# Patient Record
Sex: Female | Born: 1970 | Hispanic: Yes | Marital: Married | Smoking: Never smoker
Health system: Southern US, Community
[De-identification: ages and names within clinical notes are randomized; demographics above are authoritative.]

## PROBLEM LIST (undated history)

## (undated) DIAGNOSIS — E119 Type 2 diabetes mellitus without complications: Secondary | ICD-10-CM

---

## 2014-04-24 ENCOUNTER — Emergency Department (INDEPENDENT_AMBULATORY_CARE_PROVIDER_SITE_OTHER)
Admission: EM | Admit: 2014-04-24 | Discharge: 2014-04-24 | Disposition: A | Discharge status: Home / Self Care | Payer: Self-pay | Source: Home / Self Care | Attending: Emergency Medicine | Admitting: Emergency Medicine

## 2014-04-24 ENCOUNTER — Encounter (HOSPITAL_COMMUNITY): Payer: Self-pay | Admitting: Emergency Medicine

## 2014-04-24 DIAGNOSIS — B373 Candidiasis of vulva and vagina: Secondary | ICD-10-CM

## 2014-04-24 DIAGNOSIS — B3731 Acute candidiasis of vulva and vagina: Secondary | ICD-10-CM

## 2014-04-24 DIAGNOSIS — E119 Type 2 diabetes mellitus without complications: Secondary | ICD-10-CM

## 2014-04-24 LAB — POCT URINALYSIS DIP (DEVICE)
BILIRUBIN URINE: NEGATIVE
Ketones, ur: 15 mg/dL — AB
Leukocytes, UA: NEGATIVE
NITRITE: NEGATIVE
Protein, ur: 30 mg/dL — AB
Specific Gravity, Urine: >=1.03 (ref 1.005–1.030)
Urobilinogen, UA: 0.2 mg/dL (ref 0.0–1.0)
pH: 6 (ref 5.0–8.0)

## 2014-04-24 LAB — POCT PREGNANCY, URINE: Preg Test, Ur: NEGATIVE

## 2014-04-24 MED ORDER — FLUCONAZOLE 150 MG PO TABS: 150.0000 mg | ORAL_TABLET | Freq: Once | ORAL | Status: AC

## 2014-04-24 NOTE — Discharge Instructions (Signed)
Vulvovaginitis Candidisica (Candidal Vulvovaginitis) La vulvovaginitis candidisica es una infeccin de la vagina y la vulva. La vulva es la piel que rodea la abertura de la vagina. Puede causar picazn y Faroe Islandsmolestias dentro de y alrededor de la vagina.  CUIDADOS EN EL HOGAR  Slo tome medicamentos como lo indique su mdico.  No mantenga relaciones sexuales hasta que la infeccin haya curado o segn le indique el mdico.  Practique sexo seguro.  Informe a su compaero sexual acerca de su infeccin.  No tome duchas vaginales ni use tampones.  Use ropa interior de algodn. No utilice pantalones ni pantimedias ajustados.  Coma yogur. Esto puede ayudar a tratar y prevenir las infecciones por cndida. SOLICITE AYUDA DE INMEDIATO SI:   Tiene fiebre.  Los problemas empeoran durante el tratamiento, o si no mejora luego de [address] das.  Tiene malestar, irritacin, o picazn en la zona de la vagina o la vulva.  Siente dolor en al Gannett Comantener relaciones sexuales.  Comienza a sentir dolor abdominal. ASEGRESE DE QUE:  Comprende estas instrucciones.  Controlar su enfermedad.  Solicitar ayuda de inmediato si no mejora o empeora. Document Released: 06/19/2010 Document Revised: 05/22/2013 [facility] Patient Information 2015 [place], [place]. This information is not intended to replace advice given to you by your health care provider. Make sure you discuss any questions you have with your health care provider.  Diabetes mellitus tipo2 (Type 2 Diabetes Mellitus) La diabetes mellitus tipo2, generalmente denominada diabetes tipo2, es una enfermedad prolongada (crnica). En la diabetes tipo2, el pncreas no produce suficiente insulina (una hormona), las clulas son menos sensibles a la insulina que se produce (resistencia a la insulina), o ambos. Normalmente, la Johnson Controlsinsulina mueve los azcares de los alimentos a las clulas de los tejidos. Las clulas de los tejidos Cendant Corporationutilizan los azcares para Environmental health practitionerobtener  energa. La falta de insulina o la falta de una respuesta normal a la insulina hace que el exceso de azcar se acumule en la sangre en lugar de Customer service managerpenetrar en las clulas de los tejidos. Como resultado, se producen niveles altos de Bankerazcar en la sangre (hiperglucemia). El [address] de los niveles altos de azcar (glucosa) puede causar muchas complicaciones.  La diabetes tipo2 antes tambin se denominaba diabetes del Barreraadulto, pero puede ocurrir a Actuarycualquier edad.  FACTORES DE RIESGO  Una persona tiene mayor predisposicin a desarrollar diabetes tipo 2 si alguien en su familia tiene la enfermedad y tambin tiene uno o ms de los siguientes factores de riesgo principales:  Sobrepeso.  Estilo de vida sedentario.  Una historia de consumo constante de alimentos de altas caloras. Mantener un peso saludable y realizar actividad fsica regular puede reducir la probabilidad de desarrollar diabetes tipo 2. SNTOMAS  Una persona con diabetes tipo 2 no presenta sntomas en un principio. Los sntomas de la diabetes tipo 2 aparecen lentamente. Los sntomas son:  Aumento de la sed (polidipsia).  Aumento de la miccin (poliuria).  Orina con ms frecuencia durante la noche (nocturia).  Prdida de peso. La prdida de peso puede ser muy rpida.  Infecciones frecuentes y recurrentes.  Cansancio (fatiga).  Debilidad.  Cambios en la visin, como visin borrosa.  Olor a Water quality scientistfruta en el aliento.  Dolor abdominal.  Nuseas o vmitos.  Cortes o moretones que tardan en sanarse.  Hormigueo o adormecimiento de las manos y los pies. DIAGNSTICO Con frecuencia la diabetes tipo 2 no se diagnostica hasta que se presentan las complicaciones de la diabetes. La diabetes tipo 2 se diagnostica cuando los sntomas o las complicaciones  se presentan y McKessoncuando aumentan los niveles de glucosa en la Wildomarsangre. El nivel de glucosa en la sangre puede controlarse en uno o ms de los siguientes anlisis de sangre:  Medicin de glucosa en  la sangre en On[place]aayunas. No se le permitir comer durante al menos 8 horas antes de que se tome [place]una muestra de [place]burgsangre.  Pruebas al azar de glucosa en la sangre. El nivel de glucosa en la sangre se controla en cualquier momento del da sin importar el momento en que haya comido.  Prueba de A1c (hemoglobina glucosilada) Una prueba de A1c proporciona informacin sobre el control de la glucosa en la sangre durante los ltimos 3 meses.  Prueba de tolerancia a la glucosa oral (PTGO). La glucosa en la sangre se mide despus de no haber comido (ayunas) durante dos horas y despus de beber una bebida que contenga glucosa. TRATAMIENTO   Usted puede necesitar administrarse insulina o medicamentos para la diabetes todos los das para State Street Corporationmantener los niveles de glucosa en la sangre en el rango deseado.  Si Botswanausa insulina, tal vez necesite ajustar la dosis segn los carbohidratos que haya consumido en cada comida o colacin. El objetivo del tratamiento es mantener el nivel de azcar en la sangre previo a comer (glucosa preprandial) entre 7670 y 130mg /dl. INSTRUCCIONES PARA EL CUIDADO EN EL HOGAR   Controle su nivel de hemoglobina A1c dos veces al ao.  Contrlese a diario Air cabin crewel nivel de glucosa en la sangre segn las indicaciones de su mdico.  Supervise las cetonas en la orina cuando est enferma y segn las indicaciones de su mdico.  Tome el medicamento para la diabetes o adminstrese insulina segn las indicaciones de su mdico para Radio producermantener el nivel de glucosa en la sangre en el rango deseado.  Nunca se quede sin medicamento para la diabetes o sin insulina. Es necesario que la reciba CarMaxtodos los das.  Si [place]ausa insulina, tal vez deba ajustar la cantidad de insulina administrada segn los carbohidratos consumidos. Los hidratos de carbono pueden aumentar los niveles de glucosa en la sangre, pero deben incluirse en su dieta. Los hidratos de carbono aportan vitaminas, minerales y Taylorsvillefibra que son Neomia Dearuna parte esencial de una  dieta saludable. Los hidratos de carbono se encuentran en frutas, verduras, cereales integrales, productos lcteos, legumbres y alimentos que contienen azcares aadidos.  Consuma alimentos saludables. Programe una cita con un nutricionista certificado para que lo ayude a Chief Executive Officerestablecer un plan de alimentacin adecuado para usted.  Baje de peso si es necesario.  Lleve una tarjeta de alerta mdica o use una pulsera o medalla de alerta mdica.  Lleve con usted una colacin de 15gramos de hidratos de carbono en todo momento para controlar los niveles bajos de glucosa en la sangre (hipoglucemia). Algunos ejemplos de colaciones de 15gramos de hidratos de carbono son los siguientes:  Tabletas de glucosa, 3 o 4.  Gel de glucosa, tubo de 15 gramos.  Pasas de uva, 2 cucharadas (24 gramos).  Caramelos de goma, 6.  Galletas de Fern Parkanimales, 8.  Gaseosa comn, 4onzas (120mililitros).  Pastillas de goma, 9.  Reconocer la hipoglucemia. La hipoglucemia se produce cuando los niveles de glucosa en la sangre son de 70 mg/dl o menos. El riesgo de hipoglucemia aumenta durante el ayuno o cuando se saltea las comidas, durante o despus de Education officer, environmentalrealizar ejercicio intenso y Sibleymientras duerme. Los sntomas de hipoglucemia son:  Temblores o sacudidas.  Disminucin de la capacidad de concentracin.  Sudoracin.  Aumento de la frecuencia cardaca.  Dolor de [place].  Sequedad en la boca.  Hambre.  Irritabilidad.  Ansiedad.  Sueo agitado.  Alteracin del habla o de la coordinacin.  Confusin.  Tratar la hipoglucemia rpidamente. Si usted est alerta y puede tragar con seguridad, siga la regla de 15/15 que consiste en:  [employer] 15 y 20gramos de glucosa de accin rpida o carbohidratos. Las opciones de accin rpida son un gel de glucosa, tabletas de glucosa, o 4 onzas (120 ml) de jugo de frutas, gaseosa comn, o leche baja en grasa.  Compruebe su nivel de glucosa en la sangre 15 minutos despus de  tomar la glucosa.  Tome entre 15 y 20 gramos ms de glucosa si el nivel de glucosa en la sangre todava es de 70mg /dl o inferior.  Ingiera una comida o una colacin en el lapso de 1 hora una vez que los niveles de glucosa en la sangre vuelven a la normalidad.  Est atento a si siente mucha sed u orina con mayor frecuencia, porque son signos tempranos de hiperglucemia. El reconocimiento temprano de la hiperglucemia permite un tratamiento oportuno. Trate la hiperglucemia segn le indic su mdico.  Haga, al menos, de actividad fsica moderada a la semana, distribuidos en, por lo menos, 3das a la semana o como lo indique su mdico. Adems, debe realizar ejercicios de resistencia por lo menos 2veces a la semana o como lo indique su mdico. Trate de no permanecer inactivo durante ms de seguidos.  Ajuste su medicamento y la ingesta de alimentos, segn sea necesario, si inicia un nuevo ejercicio o deporte.  Siga su plan para los [address] de enfermedad cuando no puede comer o beber como de [place].  No consuma ningn producto que contenga tabaco, como cigarrillos, tabaco de Theatre manager o Administrator, Civil Service. Si necesita ayuda para dejar de fumar, hable con el mdico.  Limite el consumo de alcohol a no ms de 1 medida por da en las mujeres no embarazadas y 2 medidas en los hombres. Debe beber alcohol solo mientras come. Hable con su mdico acerca de si el alcohol es seguro para usted. Informe a su mdico si bebe alcohol varias veces a la semana.  Concurra a todas las visitas de control como se lo haya indicado el mdico. Esto es importante.  Programe un examen de la vista poco despus del diagnstico de diabetes tipo 2 y luego anualmente.  Realice diariamente el cuidado de la piel y de los pies. Examine su piel y los pies diariamente para ver si tiene cortes, moretones, enrojecimiento, problemas en las uas, sangrado, ampollas o Advertising account planner. Su mdico debe hacerle un examen de los  pies una vez por ao.  Cepllese los dientes y encas por lo menos dos veces al da y use hilo dental al menos una vez por da. Concurra regularmente a las visitas de control con el dentista.  Comparta su plan de control de diabetes en el trabajo o en la escuela.  Mantngase al da con las vacunas. Se recomienda que las [employer] de [id number] con diabetes se apliquen la vacuna contra la neumona. En algunos casos, pueden administrarse dos inyecciones separadas. Pregntele al mdico si tiene la vacuna contra la neumona al da.  Aprenda a Dealer.  Obtenga la mayor cantidad posible de informacin sobre la diabetes y solicite ayuda siempre que sea necesario.  Busque programas de rehabilitacin y participe en ellos para mantener o mejorar su independencia y su calidad de vida. Solicite la derivacin a fisioterapia o terapia ocupacional  si se le adormece el pie o la mano, o tiene problemas para asearse, vestirse, comer, o durante la [place] fsica. SOLICITE ATENCIN MDICA SI:   No puede comer alimentos o beber por ms de 6 horas.  Tuvo nuseas o ha vomitado durante ms de 6 horas.  Su nivel de glucosa en la sangre es mayor de 240 mg/dl.  Presenta algn cambio en el estado mental.  Desarrolla una enfermedad grave adicional.  Tuvo diarrea durante ms de 6 horas.  Ha estado enfermo o ha tenido fiebre durante un par [address] y no mejora.  Siente dolor al practicar cualquier actividad fsica. SOLICITE ATENCIN MDICA DE INMEDIATO SI:  Tiene dificultad para respirar.  Tiene niveles de cetonas moderados a altos. ASEGRESE DE QUE:  Comprende estas instrucciones.  Controlar su afeccin.  Recibir ayuda de inmediato si no mejora o si empeora. Document Released: 05/17/2005 Document Revised: 10/01/2013 [facility] Patient Information 2015 [place], [place]. This information is not intended to replace advice given to you by your health care provider. Make sure you discuss any  questions you have with your health care provider.

## 2014-04-24 NOTE — ED Provider Notes (Signed)
CSN: [id number]     Arrival date & time 04/24/14  1137 History   First MD Initiated Contact with Patient 04/24/14 1256     Chief Complaint  Patient presents with  . Urinary Tract Infection   (Consider location/radiation/quality/duration/timing/severity/associated sxs/prior Treatment) HPI Comments: Pt c/o burning with urination for 10 days but no symptoms today. Does admit to having vulvar irritation externally.   Patient is a 43 y.o. female presenting with urinary tract infection. The history is provided by the patient.  Urinary Tract Infection This is a new problem. Episode onset: 10 days ago. The problem occurs constantly. The problem has been resolved. Pertinent negatives include no abdominal pain. Exacerbated by: urinating. Nothing relieves the symptoms. She has tried nothing for the symptoms.    History reviewed. No pertinent past medical history. History reviewed. No pertinent past surgical history. History reviewed. No pertinent family history. History  Substance Use Topics  . Smoking status: Never Smoker   . Smokeless tobacco: Not on file  . Alcohol Use: No   OB History    No data available     Review of Systems  Constitutional: Negative for fever and chills.  Gastrointestinal: Negative for nausea, vomiting and abdominal pain.  Genitourinary: Positive for dysuria and vaginal discharge. Negative for flank pain.    Allergies  Review of patient's allergies indicates no known allergies.  Home Medications   Prior to Admission medications   Medication Sig Start Date End Date Taking? Authorizing Provider  fluconazole (DIFLUCAN) 150 MG tablet Take 1 tablet (150 mg total) by mouth once. May repeat in 1 week 04/24/14 05/01/14  Cathlyn ParsonsAngela M Joletta Manner, NP   BP 137/64 mmHg  Pulse 76  Temp(Src) 97.9 F (36.6 C) (Oral)  Resp 18  SpO2 98%  LMP 04/09/2014 Physical Exam  Constitutional: She appears well-developed and well-nourished. No distress.  Pulmonary/Chest: Effort normal.   Genitourinary: There is rash on the right labia. There is rash on the left labia. Vaginal discharge found.  Rash on external genitalia c/w yeast. Discharge in vagina thick, clumpy, white. C/w yeast.     ED Course  Procedures (including critical care time) Labs Review Labs Reviewed  POCT URINALYSIS DIP (DEVICE) - Abnormal; Notable for the following:    Glucose, UA >=1000 (*)    Ketones, ur 15 (*)    Hgb urine dipstick TRACE (*)    Protein, ur 30 (*)    All other components within normal limits  POCT PREGNANCY, URINE    Imaging Review No results found.   MDM   1. Candida vaginitis   2. Type 2 diabetes mellitus without complication    Pt has diabetes [facility]. Poorly controlled diabetes is likely cause of pt's infection. Pt reports is only taking glimiperide, has not been taking metformin. I reinforced to pt she needs both.     Cathlyn ParsonsAngela M Catharina Pica, NP 04/24/14 [phone]

## 2014-04-24 NOTE — ED Notes (Signed)
C/o uti sx onset 10 days Sx include dysuria and abd pain; 5/10 Denies f/v/n/d, hematuria LMP = 04/09/2014

## 2015-08-16 ENCOUNTER — Emergency Department (HOSPITAL_COMMUNITY): Payer: Self-pay

## 2015-08-16 ENCOUNTER — Encounter (HOSPITAL_COMMUNITY): Payer: Self-pay | Admitting: *Deleted

## 2015-08-16 ENCOUNTER — Emergency Department (HOSPITAL_COMMUNITY)
Admission: EM | Admit: 2015-08-16 | Discharge: 2015-08-16 | Disposition: A | Discharge status: Home / Self Care | Payer: Self-pay | Attending: Emergency Medicine | Admitting: Emergency Medicine

## 2015-08-16 DIAGNOSIS — E1165 Type 2 diabetes mellitus with hyperglycemia: Secondary | ICD-10-CM | POA: Insufficient documentation

## 2015-08-16 DIAGNOSIS — R110 Nausea: Secondary | ICD-10-CM | POA: Insufficient documentation

## 2015-08-16 DIAGNOSIS — R748 Abnormal levels of other serum enzymes: Secondary | ICD-10-CM | POA: Insufficient documentation

## 2015-08-16 DIAGNOSIS — R1013 Epigastric pain: Secondary | ICD-10-CM | POA: Insufficient documentation

## 2015-08-16 DIAGNOSIS — Z7984 Long term (current) use of oral hypoglycemic drugs: Secondary | ICD-10-CM | POA: Insufficient documentation

## 2015-08-16 DIAGNOSIS — R739 Hyperglycemia, unspecified: Secondary | ICD-10-CM

## 2015-08-16 DIAGNOSIS — Z3202 Encounter for pregnancy test, result negative: Secondary | ICD-10-CM | POA: Insufficient documentation

## 2015-08-16 HISTORY — DX: Type 2 diabetes mellitus without complications: E11.9

## 2015-08-16 LAB — COMPREHENSIVE METABOLIC PANEL
ALBUMIN: 3.8 g/dL (ref 3.5–5.0)
ALT: 65 U/L — AB (ref 14–54)
AST: 47 U/L — AB (ref 15–41)
Alkaline Phosphatase: 108 U/L (ref 38–126)
Anion gap: 15 (ref 5–15)
BILIRUBIN TOTAL: 0.7 mg/dL (ref 0.3–1.2)
BUN: 8 mg/dL (ref 6–20)
CO2: 22 mmol/L (ref 22–32)
CREATININE: 0.63 mg/dL (ref 0.44–1.00)
Calcium: 9.3 mg/dL (ref 8.9–10.3)
Chloride: 100 mmol/L — ABNORMAL LOW (ref 101–111)
GFR calc Af Amer: >60 mL/min (ref >60)
GFR calc non Af Amer: >60 mL/min (ref >60)
GLUCOSE: 302 mg/dL — AB (ref 65–99)
Potassium: 3.9 mmol/L (ref 3.5–5.1)
Sodium: 137 mmol/L (ref 135–145)
Total Protein: 7.5 g/dL (ref 6.5–8.1)

## 2015-08-16 LAB — URINALYSIS, ROUTINE W REFLEX MICROSCOPIC
BILIRUBIN URINE: NEGATIVE
Hgb urine dipstick: NEGATIVE
Nitrite: NEGATIVE
PH: 6 (ref 5.0–8.0)
PROTEIN: 30 mg/dL — AB
Specific Gravity, Urine: 1.045 — ABNORMAL HIGH (ref 1.005–1.030)

## 2015-08-16 LAB — CBC
HCT: 44.4 % (ref 36.0–46.0)
Hemoglobin: 15.2 g/dL — ABNORMAL HIGH (ref 12.0–15.0)
MCH: 28.5 pg (ref 26.0–34.0)
MCHC: 34.2 g/dL (ref 30.0–36.0)
MCV: 83.1 fL (ref 78.0–100.0)
PLATELETS: 319 10*3/uL (ref 150–400)
RBC: 5.34 MIL/uL — ABNORMAL HIGH (ref 3.87–5.11)
RDW: 12.8 % (ref 11.5–15.5)
WBC: 8.1 10*3/uL (ref 4.0–10.5)

## 2015-08-16 LAB — URINE MICROSCOPIC-ADD ON

## 2015-08-16 LAB — I-STAT BETA HCG BLOOD, ED (MC, WL, AP ONLY): I-stat hCG, quantitative: <5 m[IU]/mL (ref <5)

## 2015-08-16 LAB — LIPASE, BLOOD: Lipase: 25 U/L (ref 11–51)

## 2015-08-16 MED ORDER — GI COCKTAIL ~~LOC~~
30.0000 mL | Freq: Once | ORAL | Status: AC
  Administered 2015-08-16: 30 mL via ORAL
  Filled 2015-08-16: qty 30

## 2015-08-16 MED ORDER — NAPROXEN 500 MG PO TABS: 500.0000 mg | ORAL_TABLET | Freq: Two times a day (BID) | ORAL | Status: AC

## 2015-08-16 MED ORDER — ONDANSETRON HCL 4 MG/2ML IJ SOLN
4.0000 mg | Freq: Once | INTRAMUSCULAR | Status: AC
  Administered 2015-08-16: 4 mg via INTRAVENOUS
  Filled 2015-08-16: qty 2

## 2015-08-16 MED ORDER — SODIUM CHLORIDE 0.9 % IV BOLUS (SEPSIS)
500.0000 mL | Freq: Once | INTRAVENOUS | Status: AC
  Administered 2015-08-16: 500 mL via INTRAVENOUS

## 2015-08-16 NOTE — ED Notes (Signed)
Pt reports mid abd pain that radiates into her back x 3 days. Pain is more severe at night and with nausea. No vomiting or diarrhea.

## 2015-08-16 NOTE — ED Notes (Signed)
Pt ambulatory with steady gait to restroom, NAD

## 2015-08-16 NOTE — ED Provider Notes (Signed)
CSN: [id number]     Arrival date & time 08/16/15  1124 History   First MD Initiated Contact with Patient 08/16/15 1336     Chief Complaint  Patient presents with  . Abdominal Pain     (Consider location/radiation/quality/duration/timing/severity/associated sxs/prior Treatment) Patient is a 45 y.o. female presenting with abdominal pain. The history is provided by the patient. The history is limited by a language barrier. A language interpreter was used ([place], daughter).  Abdominal Pain Pain location:  Epigastric Pain quality: sharp   Pain radiates to:  Back Pain severity:  Moderate Onset quality:  Gradual Duration:  3 days Timing:  Constant (but worse at night) Progression:  Unchanged Chronicity:  New Context: not alcohol use   Relieved by:  Nothing Exacerbated by: lying flat. Ineffective treatments: OTC pain medications. Associated symptoms: nausea   Associated symptoms: no chest pain, no chills, no diarrhea, no dysuria, no fever, no hematemesis, no hematochezia, no hematuria, no shortness of breath and no vomiting     Past Medical History  Diagnosis Date  . Diabetes mellitus without complication (HCC)    History reviewed. No pertinent past surgical history. History reviewed. No pertinent family history. Social History  Substance Use Topics  . Smoking status: Never Smoker   . Smokeless tobacco: None  . Alcohol Use: No   OB History    No data available     Review of Systems  Constitutional: Negative for fever and chills.  Respiratory: Negative for shortness of breath.   Cardiovascular: Negative for chest pain.  Gastrointestinal: Positive for nausea and abdominal pain. Negative for vomiting, diarrhea, hematochezia and hematemesis.  Genitourinary: Negative for dysuria and hematuria.  All other systems reviewed and are negative.     Allergies  Review of patient's allergies indicates no known allergies.  Home Medications   Prior to Admission medications    Medication Sig Start Date End Date Taking? Authorizing Provider  glimepiride (AMARYL) 4 MG tablet Take 4 mg by mouth daily with breakfast.   Yes Historical Provider, MD  metFORMIN (GLUCOPHAGE) 850 MG tablet Take 850 mg by mouth 2 (two) times daily with a meal.   Yes Historical Provider, MD   BP 115/85 mmHg  Pulse 81  Temp(Src) 98.1 F (36.7 C) (Oral)  Resp 17  SpO2 98%  LMP 07/19/2015 Physical Exam  Constitutional: She is oriented to person, place, and time. She appears well-developed and well-nourished.  Non-toxic appearance. She does not have a sickly appearance. She does not appear ill.  HENT:  Head: Normocephalic and atraumatic.  Mouth/Throat: Oropharynx is clear and moist.  Eyes: Conjunctivae are normal.  Neck: Normal range of motion. Neck supple.  Cardiovascular: Normal rate, regular rhythm and normal heart sounds.   No murmur heard. Pulmonary/Chest: Effort normal and breath sounds normal. No accessory muscle usage or stridor. No respiratory distress. She has no wheezes. She has no rhonchi. She has no rales.  Abdominal: Soft. Bowel sounds are normal. She exhibits no distension. There is no tenderness. There is no rebound, no guarding and negative Murphy's sign.  Musculoskeletal: Normal range of motion.  Lymphadenopathy:    She has no cervical adenopathy.  Neurological: She is alert and oriented to person, place, and time.  Speech clear without dysarthria.  Skin: Skin is warm and dry.  Psychiatric: She has a normal mood and affect. Her behavior is normal.    ED Course  Procedures (including critical care time) Labs Review Labs Reviewed  COMPREHENSIVE METABOLIC PANEL - Abnormal; Notable for  the following:    Chloride 100 (*)    Glucose, Bld 302 (*)    AST 47 (*)    ALT 65 (*)    All other components within normal limits  CBC - Abnormal; Notable for the following:    RBC 5.34 (*)    Hemoglobin 15.2 (*)    All other components within normal limits  URINALYSIS, ROUTINE  W REFLEX MICROSCOPIC (NOT AT [facility]) - Abnormal; Notable for the following:    Color, Urine AMBER (*)    APPearance CLOUDY (*)    Specific Gravity, Urine 1.045 (*)    Glucose, UA >1000 (*)    Ketones, ur >80 (*)    Protein, ur 30 (*)    Leukocytes, UA TRACE (*)    All other components within normal limits  URINE MICROSCOPIC-ADD ON - Abnormal; Notable for the following:    Squamous Epithelial / LPF 6-30 (*)    Bacteria, UA MANY (*)    All other components within normal limits  URINE CULTURE  LIPASE, BLOOD  I-STAT BETA HCG BLOOD, ED ([facility], [facility], [facility] ONLY)    Imaging Review US Abdomen Complete  08/16/2015  CLINICAL DATA:  Three-day history of epigastric region pain EXAM: ABDOMEN ULTRASOUND COMPLETE COMPARISON:  None. FINDINGS: Gallbladder: No gallstones or wall thickening visualized. There is no pericholecystic fluid. No sonographic Murphy sign noted by sonographer. Common bile duct: Diameter: 4 mm. There is no intrahepatic, common hepatic, or common bile duct dilatation. Liver: No focal lesion identified. Liver echogenicity overall is increased. IVC: No abnormality visualized. Pancreas: No mass or inflammatory focus. Spleen: Size and appearance within normal limits. Right Kidney: Length: 14.7 cm. Echogenicity within normal limits. No mass or hydronephrosis visualized. Left Kidney: Length: 12.4 cm. Echogenicity within normal limits. No mass or hydronephrosis visualized. Abdominal aorta: No aneurysm visualized. Other findings: No demonstrable ascites. IMPRESSION: There is diffuse increase in liver echogenicity, a finding most indicative of hepatic steatosis. While no focal liver lesions are identified, it must be cautioned that the sensitivity of ultrasound for focal liver lesions is diminished in this circumstance. Right kidney appears mildly prominent with normal contour and echogenicity. Significance of the size discrepancy between the kidneys is uncertain. Study otherwise unremarkable. Electronically  Signed   By: Bretta Bang III M.D.   On: 08/16/2015 15:31   I have personally reviewed and evaluated these images and lab results as part of my medical decision-making.   EKG Interpretation   Date/Time:  Saturday August 16 2015 14:20:53 EDT Ventricular Rate:  76 PR Interval:  155 QRS Duration: 92 QT Interval:  402 QTC Calculation: 452 R Axis:   46 Text Interpretation:  Sinus rhythm no acute ST/T changes No old tracing to  compare Confirmed by GOLDSTON  MD, SCOTT ([id number]) on 08/16/2015 2:58:24 PM      MDM   Final diagnoses:  Epigastric pain  Elevated liver enzymes  Hyperglycemia   Patient presents with epigastric abdominal pain, worse at night, sharp, radiates to the back.  No V/D.  No EtOH, tobacco use.  No abdominal surgeries.  Hx of DM.  VSS, NAD.  On exam, heart RRR, lungs CTAB, abdomen soft with mild tenderness in epigastrium.  No murphy's sign.  AST 47 ALT 65, otherwise unremarkable.  Glucose 302, no anion gap, no N/V.  UA shows glucose, ketones, trace leukocytes, bacteria, but squamous epithelial cells.  Doubt DKA.  Doubt infection.EKG shows SR without acute changes.  Doubt cardiac etiology. Patient given zofran and GI cocktail.  Possible GERD vs gall bladder etiology, will obtain abdominal ultrasound.  Doubt pancreatitis, lipase 25.   Urine culture obtained.  Abdominal ultrasound remarkable for diffuse increase in liver echogenicity, probable hepatic steatosis.  Patient reports missing the last 2 days of her DM medications; otherwise she has been compliant.  Discussed these findings with the patient.  Recommend avoiding Tylenol.  [facility]  Discussed return precautions including N/V, increasing abdominal pain, increased thirst/urination.  Patient agrees and acknowledges the above plan for discharge.  Case has been discussed with Dr. Criss AlvineGoldston who agrees with the above plan for discharge.      Cheri FowlerKayla Primitivo Merkey, PA-C 08/16/15 1802  Pricilla LovelessScott Goldston,  MD 08/17/15 [phone]

## 2015-08-16 NOTE — Discharge Instructions (Signed)
1. Continue home medications.  Make sure you take your Diabetes medications, do not miss doses!  Lab work today showed mild elevation for your liver enzymes as well as ketones and glucose in your urine.  Abdominal ultrasound showed possible hepatic steatosis, or fatty liver.  This can occur when your blood sugar is not well controlled.  It is extremely important that you follow up with [facility] for blood sugar management with your diabetes and for monitoring of your liver enzymes.   Hgado graso  (Fatty Liver)  El hgado graso, tambin llamado esteatosis heptica o esteatohepatitis, es una enfermedad en la que se acumula demasiada grasa en las clulas del hgado. El hgado elimina las sustancias dainas del torrente sanguneo y produce lquidos que el cuerpo necesita. Tambin ayuda al cuerpo a Chemical engineer y Academic librarian la energa obtenida de los alimentos que come.   En muchos casos, el hgado graso no provoca sntomas ni problemas. Con frecuencia, se diagnostica cuando se realizan estudios por otros motivos. Sin embargo, con Museum/gallery conservator, el hgado graso puede provocar una inflamacin que puede causar problemas hepticos ms graves, como fibrosis heptica (cirrosis).  CAUSAS  Las causas del hgado graso pueden incluir las siguientes:  Beber alcohol en exceso.  Dficit nutricional.  Obesidad.  Sndrome de [place].  Diabetes.  Hiperlipidemia.  Embarazo.  Determinados medicamentos.  Txicos.  Algunas infecciones virales. FACTORES DE RIESGO  Puede tener ms probabilidades de tener hgado graso si:  Consume alcohol en exceso.  Est embarazada.  Tiene sobrepeso.  Tiene diabetes.  Tiene hepatitis.  Tiene un nivel alto de triglicridos. SIGNOS Y SNTOMAS  El hgado graso con frecuencia no provoca sntomas. En los casos en que se presentan sntomas, estos pueden incluir:  Fatiga.  Debilidad.  Prdida de peso.  Confusin.  Dolor abdominal.  Color amarillo en la piel y en la  zona blanca de los ojos (ictericia).  Nuseas y vmitos. DIAGNSTICO  El hgado graso se puede diagnosticar mediante lo siguiente:  Un examen fsico y Neomia Dear historia clnica.  Anlisis de [place].  Estudios de diagnstico por imgenes, como ecografa, tomografa computarizada (TC) o Health visitor (RM).  Biopsia de hgado. Se extrae una pequea muestra de tejido del hgado usando [place]. La muestra se examina en el microscopio. TRATAMIENTO  El hgado graso con frecuencia es causado por otras enfermedades. El tratamiento para el hgado graso puede incluir medicamentos y cambios en el estilo de vida para controlar enfermedades como:  Alcoholismo.  Colesterol elevado.  Diabetes.  Exceso de [place] u obesidad. INSTRUCCIONES PARA EL CUIDADO EN EL HOGAR  Siga una dieta saludable como se lo haya indicado el mdico.  Haga ejercicios regularmente. Esto puede ayudarlo a [employer], y a Public house manager y la diabetes. Hable con el mdico sobre qu actividades son mejores para usted y IT trainer de ejercicios.  No beba alcohol.  Tome los medicamentos solamente como se lo haya indicado el mdico. SOLICITE ATENCIN MDICA SI:  Tiene dificultad para controlar lo siguiente:  Nivel de [place].  Colesterol.  Consumo de alcohol. SOLICITE ATENCIN MDICA DE INMEDIATO SI:  Siente dolor abdominal.  Tiene ictericia.  Tiene nuseas o vmitos. Esta informacin no tiene Theme park manager el consejo del mdico. Asegrese de hacerle al mdico cualquier pregunta que tenga.  Document Released: 03/07/2013 Document Revised: 06/07/2014  Elsevier Interactive Patient Education 2016 Elsevier Inc.    Hiperglucemia (Hyperglycemia) La hiperglucemia ocurre cuando la glucosa (azcar) en su sangre  est demasiado elevada. Puede suceder por varias razones, pero a menudo ocurre en personas que no saben que tienen diabetes o no la controlan adecuadamente.  CAUSAS Tanto si tiene diabetes como  si no, existen otras causas para la hiperglucemia. La hiperglucemia puede producirse cuando tiene diabetes, pero tambin puede presentarse en otras situaciones de las que podra no ser consciente, como por ejemplo: Diabetes  Si tiene diabetes y tiene problemas para controlar su glucosa en sangre, la hiperglucemia podra producirse debido a las siguientes razones:  No seguir Press photographer.  No tomar los medicamentos para la diabetes o tomarlos de forma inadecuada.  Realizar menos ejercicio del que normalmente hace.  Estar enfermo. Prediabetes  Esto no puede ignorarse. Antes de que la persona presente diabetes de tipo 2, casi siempre hay "prediabetes". Esto ocurre cuando su glucosa en sangre es mayor que lo normal, pero no lo suficiente como para diagnosticar diabetes. La investigacin ha demostrado que algunos daos al cuerpo de Air cabin crew, en especial los del corazn y el sistema circulatorio, podran haber ocurrido durante el periodo de prediabetes. Si controla la glucosa en sangre cuando tiene prediabetes, podr retardar o evitar que se desarrrolle la diabetes tipo 2. El estrs  Si tiene diabetes, deber hacer una dieta, tomar medicamentos orales o insulina para [place]. Sin embargo, Clinical research associate que la glucosa en sangre es mayor que lo normal en el hospital tenga o no diabetes. Cientficamente se lo denomina "hiperglucemia por estrs". El estrs puede elevar su glucosa en sangre. Esto ocurre porque el organismo genera hormonas en los momentos de estrs. Si el estrs ha [employer] causa del alto nivel de glucosa en [place], [place] mdico podr Education officer, environmental un seguimiento de [place] regular. Feliberto Harts, podr asegurarse de que la hiperglucemia no empeora o progresa hacia diabetes. Esteroides  Los esteroides son medicamentos que actan en la infeccin que ataca al sistema inmunolgico para bloquear la inflamacin o la infeccin. Un efecto secundario puede ser el aumento de glucosa en [place].  Muchas personas pueden producir la suficiente insulina extra para este aumento, pero aquellos que no pueden, los esteroides pueden [employer] niveles sean an [place]. No es inusual que los tratamientos con esteorides "destapen" una diabetes que se est desarrollando. No siempre es posible determinar si la hiperglucemia desaparecer una vez que se detenga el consumo de esteroides. A veces se realiza un anlisis de sangre especial denominado A1c para determinar si la glucosa en sangre se ha elevado antes de comenzar con el consumo de esteroides. SNTOMAS  Sed.  Necesidad frecuente de Geographical information systems officer.  [employer].  Visin borrosa.  Cansancio o fatiga.  Debilidad.  Somnolencia.  Hormigueo en el pie o pierna. DIAGNSTICO El diagnstico se realiza mediante el control de la glucosa en sangre de una o varias de las siguientes maneras:  Anlisis A1c. Es una sustancia qumica que se encuentra en la [place].  Control de glucosa en sangre con tiras de prueba.  Resultados de laboratorio. TRATAMIENTO Primero, es importante conocer la causa de la hiperglucemia antes de tratarla. El tratamiento puede ser el siguiente, [place] pueden ser otros:  Educacin  Cambios o ajustes en los medicamentos.  Cambios o ajustes en el plan de alimentacin.  Tratamiento por enfermedades, infecciones, etc.  Control de glucosa en sangre ms frecuente.  Cambios en el plan de ejercicios.  Disminucin o interrupcin del consumo de esteroides.  Cambios en el estilo de vida. INSTRUCCIONES PARA EL CUIDADO DOMICILIARIO  Contrlese la glucosa en sangre, como se  lo indicaron.  Haga ejercicios regularmente. El profesional que lo asiste le dar instrucciones relacionadas con el ejercicio fsico. La prediabetes que es consecuencia de situaciones de estrs, puede mejorar con la actividad fsica.  Consuma alimentos saludables y balanceados. Coma a menudo y de Maxatawnymanera regular, en momentos fijos. El profesional o el  nutricionista le dar una dieta especial para controlar su ingestin de azcar.  Mantener su peso ideal es importante. Si lo necesita, perder un poco de peso, como 5  7 Kg. puede ser beneficioso para AES Corporationmejorar los niveles de Production assistant, radioazcar en sangre. SOLICITE ATENCIN MDICA SI:  Tiene preguntas relacionadas con los medicamentos, la actividad o la dieta.  Contina teniendo sntomas (como mucha sed, deseos intensos de Geographical information systems officerorinar o aumento de peso) SOLICITE ATENCIN MDICA DE INMEDIATO SI:  Vomita o tiene diarrea.  Su respiracin huele frutal.  La frecuencia respiratoria es ms rpida o ms lenta.  Est somnoliento o incoherente.  Siente adormecimiento, hormigueos o Tax adviserdolor en los pies o en las manos.  Siente dolor en el pecho.  Sus sntomas empeoran aunque haya seguido las indicaciones de su mdico.  Tiene otras preguntas o preocupaciones.   Esta informacin no tiene Theme park managercomo fin reemplazar el consejo del mdico. Asegrese de hacerle al mdico cualquier pregunta que tenga.   Document Released: 05/17/2005 Document Revised: 08/09/2011 Elsevier Interactive Patient Education [employer]2016 Elsevier Inc.

## 2015-08-18 LAB — URINE CULTURE

## 2015-09-10 ENCOUNTER — Ambulatory Visit: Payer: Self-pay | Attending: Internal Medicine

## 2018-03-05 IMAGING — US US ABDOMEN COMPLETE
1 series · 13 of 25 positions shown · non-contrast
Comparison: None.

CLINICAL DATA: Three-day history of epigastric region pain

EXAM:
ABDOMEN ULTRASOUND COMPLETE

[Series 1: us abdomen complete · 0.25mm/px · 13 of 70 slices shown]
[im 1/70]
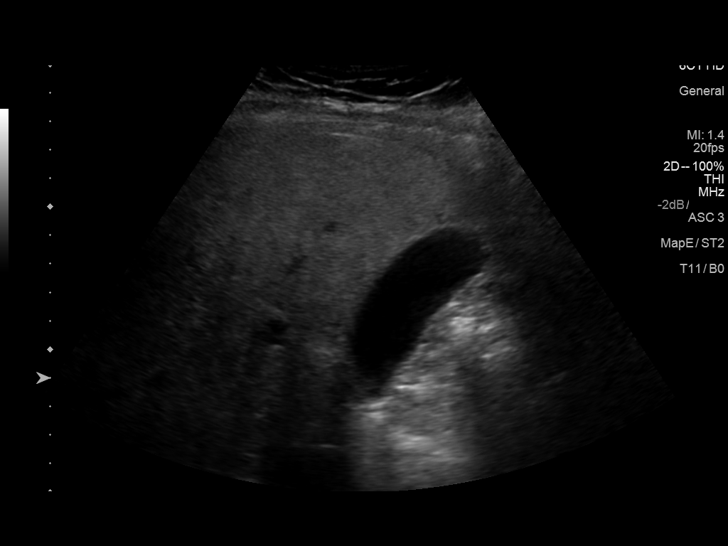
[im 6/70]
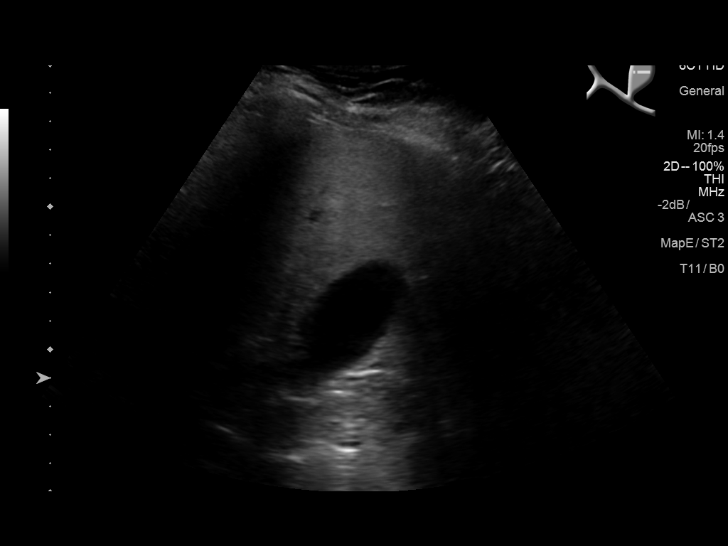
[im 12/70]
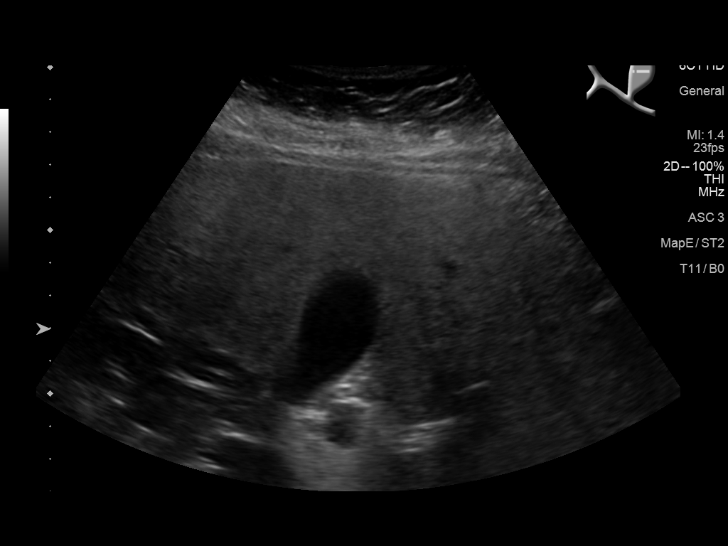
[im 18/70]
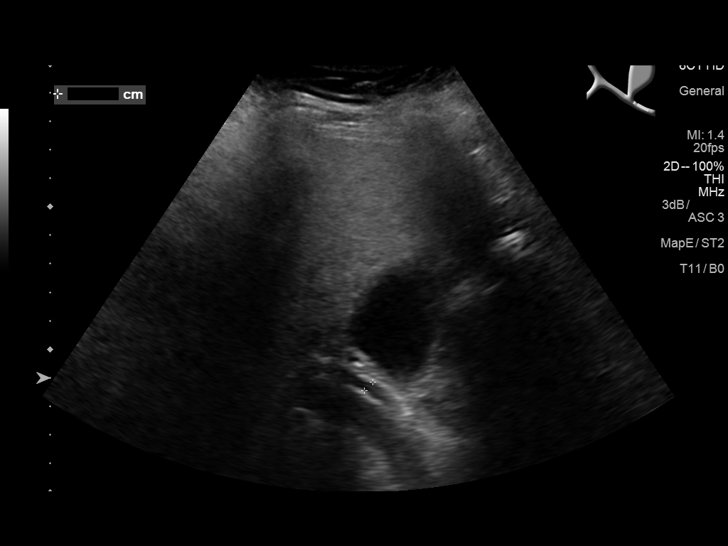
[im 24/70]
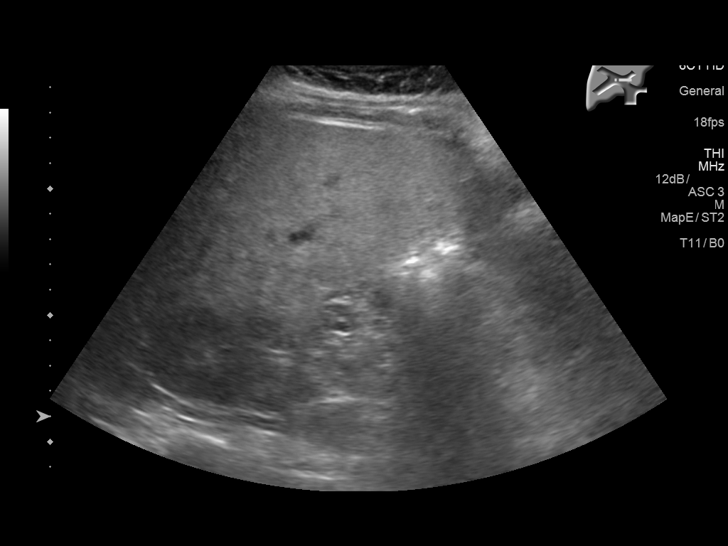
[im 29/70]
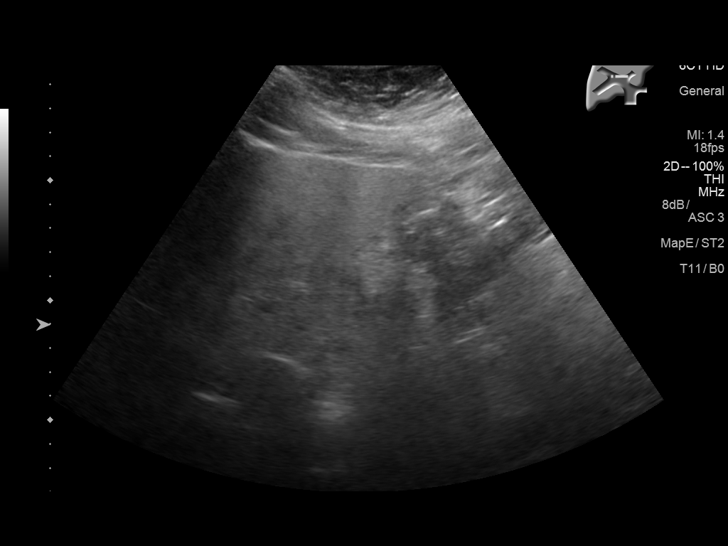
[im 35/70]
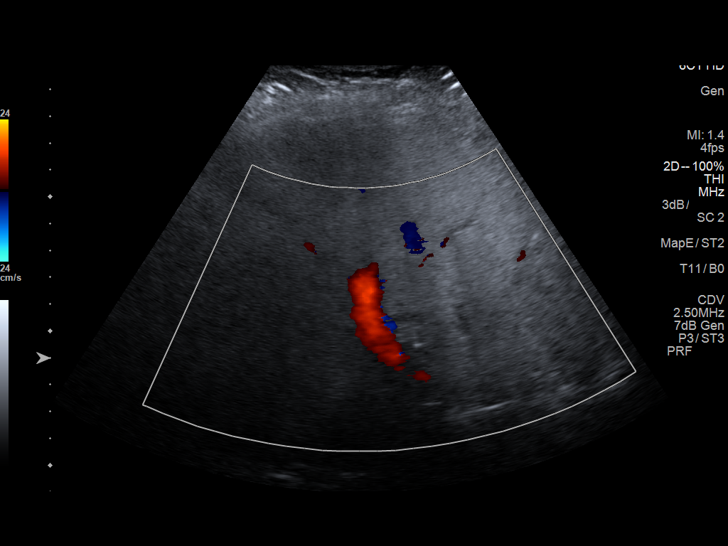
[im 41/70]
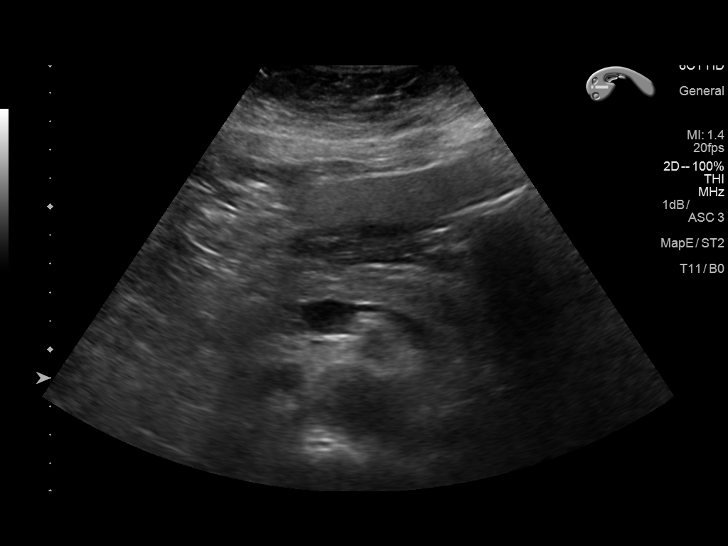
[im 47/70]
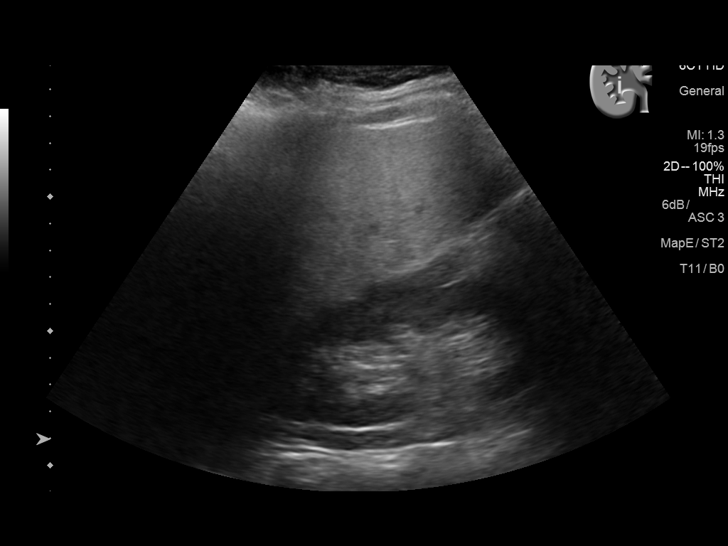
[im 52/70]
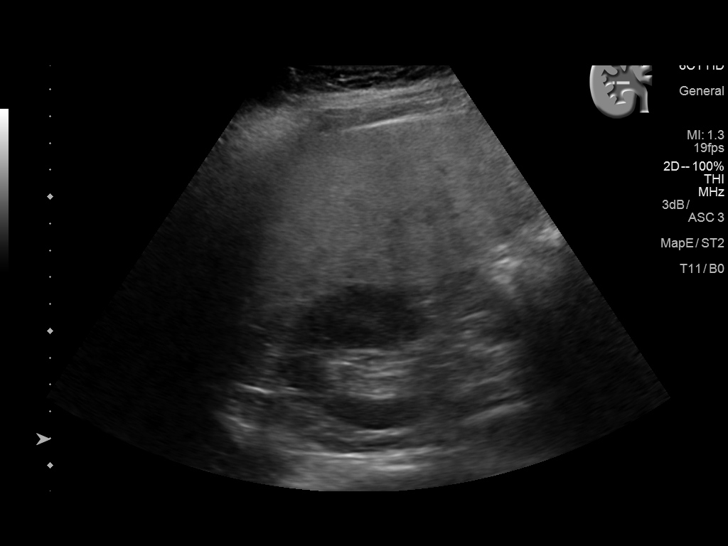
[im 58/70]
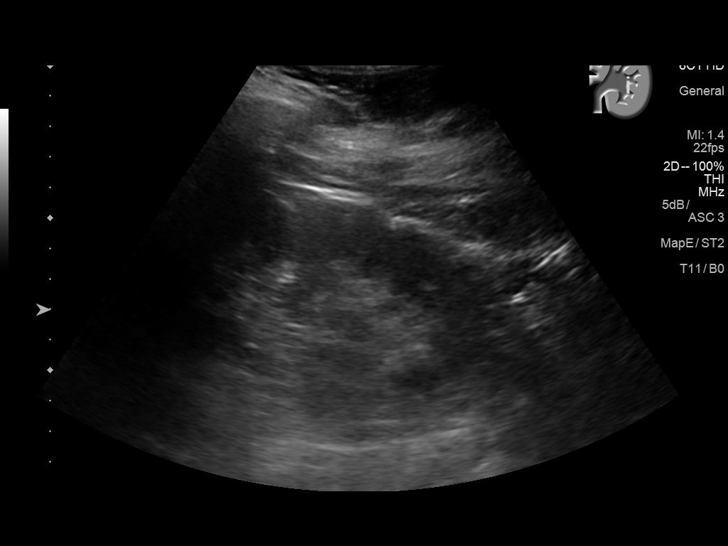
[im 64/70]
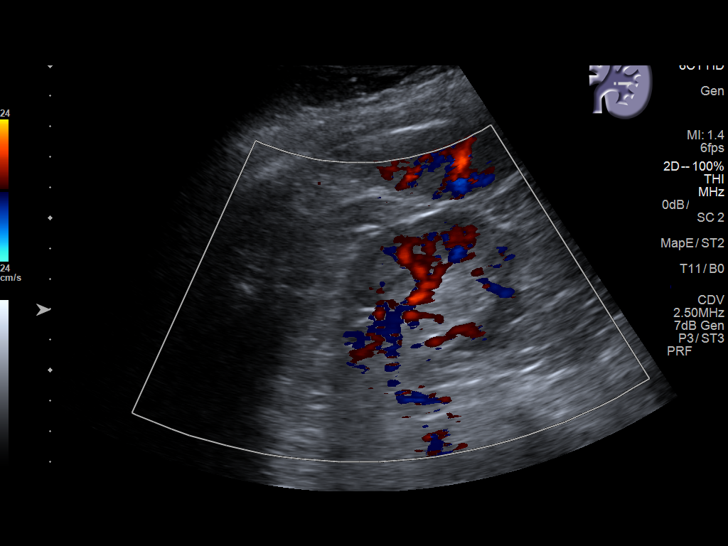
[im 70/70]
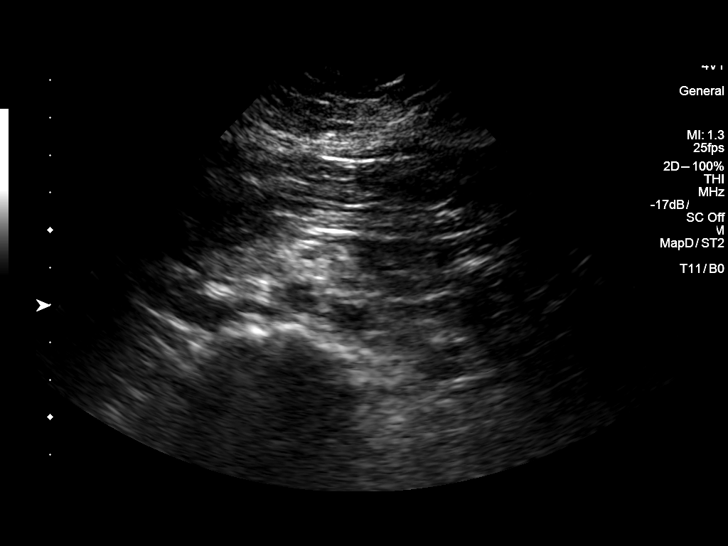

[13 of 25 positions shown; findings below may reference images not displayed]

FINDINGS: Gallbladder: No gallstones or wall thickening visualized. There is
no pericholecystic fluid. No sonographic Murphy sign noted by
sonographer.

Common bile duct: Diameter: 4 mm. There is no intrahepatic, common
hepatic, or common bile duct dilatation.

Liver: No focal lesion identified. Liver echogenicity overall is
increased.

IVC: No abnormality visualized.

Pancreas: No mass or inflammatory focus.

Spleen: Size and appearance within normal limits.

Right Kidney: Length: 14.7 cm. Echogenicity within normal limits. No
mass or hydronephrosis visualized.

Left Kidney: Length: 12.4 cm. Echogenicity within normal limits. No
mass or hydronephrosis visualized.

Abdominal aorta: No aneurysm visualized.

Other findings: No demonstrable ascites.
IMPRESSION: There is diffuse increase in liver echogenicity, a finding most
indicative of hepatic steatosis. While no focal liver lesions are
identified, it must be cautioned that the sensitivity of ultrasound
for focal liver lesions is diminished in this circumstance.

Right kidney appears mildly prominent with normal contour and
echogenicity. Significance of the size discrepancy between the
kidneys is uncertain.

Study otherwise unremarkable.

## 2019-10-30 ENCOUNTER — Ambulatory Visit (HOSPITAL_COMMUNITY)
Admission: EM | Admit: 2019-10-30 | Discharge: 2019-10-30 | Disposition: A | Discharge status: Home / Self Care | Payer: Self-pay

## 2019-10-30 ENCOUNTER — Other Ambulatory Visit: Payer: Self-pay

## 2019-10-30 ENCOUNTER — Encounter (HOSPITAL_COMMUNITY): Payer: Self-pay

## 2019-10-30 DIAGNOSIS — E1165 Type 2 diabetes mellitus with hyperglycemia: Secondary | ICD-10-CM

## 2019-10-30 DIAGNOSIS — S90219A Contusion of unspecified great toe with damage to nail, initial encounter: Secondary | ICD-10-CM

## 2019-10-30 LAB — CBG MONITORING, ED: Glucose-Capillary: 232 mg/dL — ABNORMAL HIGH (ref 70–99)

## 2019-10-30 MED ORDER — METFORMIN HCL 1000 MG PO TABS: 1000.0000 mg | ORAL_TABLET | Freq: Two times a day (BID) | ORAL | 0 refills | Status: AC

## 2019-10-30 NOTE — ED Triage Notes (Addendum)
Pt reports pain in the left foot and numbness and change of color in the left big toe, no know trauma or injury. Pt is taking ampicillin 500 mg she have at her house.

## 2019-10-30 NOTE — Discharge Instructions (Addendum)

## 2019-10-30 NOTE — ED Provider Notes (Signed)
[facility]   MRN: [id number] DOB: 12-27-1970  Subjective:   Bonnie Moore is a 49 y.o. female presenting for left great toenail color change.  Patient states that she cannot recall any specific trauma or injury but her husband pointed out that it was a bit purple just under the nail.  Patient has diabetes, admits decreased sensation of her feet.  Has not noticed any wounds.  She ended up taking an antibiotic, ampicillin as she researched this online and thought it could help.  Admits poor dietary choices but does not know what is okay to eat with her diabetes.  She has an appointment to see a new PCP in July.  No current facility-administered medications for this encounter.  Current Outpatient Medications:  .  glimepiride (AMARYL) 4 MG tablet, Take 4 mg by mouth daily with breakfast., Disp: , Rfl:  .  metFORMIN (GLUCOPHAGE) 850 MG tablet, Take 850 mg by mouth 2 (two) times daily with a meal., Disp: , Rfl:  .  naproxen (NAPROSYN) 500 MG tablet, Take 1 tablet (500 mg total) by mouth 2 (two) times daily., Disp: 30 tablet, Rfl: 0   No Known Allergies  Past Medical History:  Diagnosis Date  . Diabetes mellitus without complication ([place])      No past surgical history on file.  No family history on file.  Social History   Tobacco Use  . Smoking status: Never Smoker  Substance Use Topics  . Alcohol use: No  . Drug use: No    ROS   Objective:   Vitals: BP (!) 148/88 (BP Location: Left Arm)   Pulse 86   Temp 98.1 F (36.7 C) (Oral)   Resp 16   LMP  (Within Months) Comment: 1 month  SpO2 100%   Physical Exam Constitutional:      General: She is not in acute distress.    Appearance: Normal appearance. She is well-developed. She is not ill-appearing, toxic-appearing or diaphoretic.  HENT:     Head: Normocephalic and atraumatic.     Nose: Nose normal.     Mouth/Throat:     Mouth: Mucous membranes are moist.  Eyes:     Extraocular Movements: Extraocular  movements intact.     Pupils: Pupils are equal, round, and reactive to light.  Cardiovascular:     Rate and Rhythm: Normal rate and regular rhythm.     Pulses: Normal pulses.     Heart sounds: Normal heart sounds. No murmur. No friction rub. No gallop.   Pulmonary:     Effort: Pulmonary effort is normal. No respiratory distress.     Breath sounds: Normal breath sounds. No stridor. No wheezing, rhonchi or rales.  Musculoskeletal:       Legs:     Comments: Brisk capillary refill, no obvious wounds.  Skin:    General: Skin is warm and dry.     Findings: No rash.  Neurological:     Mental Status: She is alert and oriented to person, place, and time.  Psychiatric:        Mood and Affect: Mood normal.        Behavior: Behavior normal.        Thought Content: Thought content normal.        Judgment: Judgment normal.     Results for orders placed or performed during the hospital encounter of 10/30/19 (from the past 24 hour(s))  POC CBG monitoring     Status: Abnormal   Collection Time: 10/30/19 11:09  AM  Result Value Ref Range   Glucose-Capillary 232 (H) 70 - 99 mg/dL    Assessment and Plan :   PDMP not reviewed this encounter.  1. Subungual hematoma of great toe   2. Uncontrolled type 2 diabetes mellitus with hyperglycemia (HCC)     Counseled on conservative management and monitoring of her subungual hematoma.  Discussed management of her diabetes, increase Metformin to 1000 mg twice a day.  Emphasized need for significant dietary modifications.  Recommended she maintain her appointment with her new PCP in July, provide her with information to new providers there Spanish-speaking as well. Counseled patient on potential for adverse effects with medications prescribed/recommended today, ER and return-to-clinic precautions discussed, patient verbalized understanding.    Wallis Bamberg, PA-C 10/30/19 1300

## 2019-11-29 ENCOUNTER — Ambulatory Visit: Payer: Self-pay | Admitting: Internal Medicine

## 2022-04-01 ENCOUNTER — Ambulatory Visit
Admission: EM | Admit: 2022-04-01 | Discharge: 2022-04-01 | Disposition: A | Discharge status: Home / Self Care | Payer: Self-pay | Attending: Nurse Practitioner | Admitting: Nurse Practitioner

## 2022-04-01 DIAGNOSIS — L03011 Cellulitis of right finger: Secondary | ICD-10-CM

## 2022-04-01 MED ORDER — MUPIROCIN 2 % EX OINT: 1.0000 | TOPICAL_OINTMENT | Freq: Two times a day (BID) | CUTANEOUS | 0 refills | Status: AC

## 2022-04-01 MED ORDER — DOXYCYCLINE HYCLATE 100 MG PO CAPS: 100.0000 mg | ORAL_CAPSULE | Freq: Two times a day (BID) | ORAL | 0 refills | Status: AC

## 2022-04-01 NOTE — ED Provider Notes (Signed)
[facility]    CSN: [id number] Arrival date & time: 04/01/22  1031      History   Chief Complaint Chief Complaint  Patient presents with   finger problem    HPI Rosaura Bolon is a 51 y.o. female.   Patient presents for 1 day of right thumbnail pain and pus under the thumbnail.  Reports approximately 15 days ago, she was peeling a lot of oranges and lemons and thinks she may have gotten something under her nail.  Reports normally, her fingernails look the same, but currently, the right thumbnail has a little bit of white discoloration underneath it.  It is painful and there is pain shooting down to her the bottom of her thumb.  She denies active drainage, redness, fever, nausea/vomiting.  She denies numbness or tingling in her fingertip.  Has been trying warm soaks with minimal relief.  Spanish interpreter utilized during entire visit     Past Medical History:  Diagnosis Date   Diabetes mellitus without complication (HCC)     There are no problems to display for this patient.   History reviewed. No pertinent surgical history.  OB History   No obstetric history on file.      Home Medications    Prior to Admission medications   Medication Sig Start Date End Date Taking? Authorizing Provider  doxycycline (VIBRAMYCIN) 100 MG capsule Take 1 capsule (100 mg total) by mouth 2 (two) times daily. 04/01/22  Yes Valentino Nose, NP  mupirocin ointment (BACTROBAN) 2 % Apply 1 Application topically 2 (two) times daily. 04/01/22  Yes Valentino Nose, NP  glimepiride (AMARYL) 4 MG tablet Take 4 mg by mouth daily with breakfast.    [provider]  metFORMIN (GLUCOPHAGE) 1000 MG tablet Take 1 tablet (1,000 mg total) by mouth 2 (two) times daily with a meal. 10/30/19   Wallis Bamberg, PA-C  naproxen (NAPROSYN) 500 MG tablet Take 1 tablet (500 mg total) by mouth 2 (two) times daily. 08/16/15   Cheri Fowler, PA-C    Family History History reviewed. No pertinent  family history.  Social History Social History   Tobacco Use   Smoking status: Never   Smokeless tobacco: Never  Substance Use Topics   Alcohol use: No   Drug use: Never     Allergies   Patient has no known allergies.   Review of Systems Review of Systems Per HPI  Physical Exam Triage Vital Signs ED Triage Vitals  Enc Vitals Group     BP 04/01/22 1047 137/76     Pulse Rate 04/01/22 1047 80     Resp 04/01/22 1047 17     Temp 04/01/22 1047 (!) 97.4 F (36.3 C)     Temp Source 04/01/22 1047 Oral     SpO2 04/01/22 1047 95 %     Weight --      Height --      Head Circumference --      Peak Flow --      Pain Score 04/01/22 1045 8     Pain Loc --      Pain Edu? --      Excl. in GC? --    No data found.  Updated Vital Signs BP 137/76 (BP Location: Right Arm)   Pulse 80   Temp (!) 97.4 F (36.3 C) (Oral)   Resp 17   LMP  (Within Weeks) Comment: 3 weeks  SpO2 95%   Visual Acuity Right Eye Distance:  Left Eye Distance:   Bilateral Distance:    Right Eye Near:   Left Eye Near:    Bilateral Near:     Physical Exam Vitals and nursing note reviewed.  Constitutional:      General: She is not in acute distress.    Appearance: Normal appearance. She is not toxic-appearing.  HENT:     Mouth/Throat:     Mouth: Mucous membranes are moist.     Pharynx: Oropharynx is clear.  Pulmonary:     Effort: Pulmonary effort is normal. No respiratory distress.  Musculoskeletal:       Hands:     Comments: White discoloration under right thumb nail in approximately area marked.  White area measures approximately 0.25 cm x 0.25 cm.  No active drainage.  White area is tender to palpation.  No swelling, decreased sensation, or decreased range of motion to thumb.  Capillary refill less than 3 seconds.  Skin:    General: Skin is warm and dry.     Capillary Refill: Capillary refill takes less than 2 seconds.     Coloration: Skin is not jaundiced or pale.     Findings: No  erythema.  Neurological:     Mental Status: She is alert and oriented to person, place, and time.  Psychiatric:        Behavior: Behavior is cooperative.      UC Treatments / Results  Labs (all labs ordered are listed, but only abnormal results are displayed) Labs Reviewed - No data to display  EKG   Radiology No results found.  Procedures Procedures (including critical care time)  Medications Ordered in UC Medications - No data to display  Initial Impression / Assessment and Plan / UC Course  I have reviewed the triage vital signs and the nursing notes.  Pertinent labs & imaging results that were available during my care of the patient were reviewed by me and considered in my medical decision making (see chart for details).   Patient is well-appearing, normotensive, afebrile, not tachycardic, not tachypneic, oxygenating well on room air.    Paronychia of right thumb No indication for I&D today; no abscess appreciable Will cover patient with doxycycline, Bactroban given history of diabetes Continue warm salt water soaks Return precautions discussed  The patient was given the opportunity to ask questions.  All questions answered to their satisfaction.  The patient is in agreement to this plan.    Final Clinical Impressions(s) / UC Diagnoses   Final diagnoses:  Paronychia of right thumb     Discharge Instructions      Continue the warm salt water soaks, do this twice daily for 15 minutes  Start the doxycycline (antibiotic) to treat underlying infection.  You can put on the ointment twice daily to help also prevent topical infection  Follow-up if symptoms persist or worsen despite treatment       ED Prescriptions     Medication Sig Dispense Auth. Provider   doxycycline (VIBRAMYCIN) 100 MG capsule Take 1 capsule (100 mg total) by mouth 2 (two) times daily. 14 capsule Noemi Chapel A, NP   mupirocin ointment (BACTROBAN) 2 % Apply 1 Application topically  2 (two) times daily. 22 g Eulogio Bear, NP      PDMP not reviewed this encounter.   Eulogio Bear, NP 04/01/22 1145

## 2022-04-01 NOTE — Discharge Instructions (Signed)
Continue the warm salt water soaks, do this twice daily for 15 minutes  Start the doxycycline (antibiotic) to treat underlying infection.  You can put on the ointment twice daily to help also prevent topical infection  Follow-up if symptoms persist or worsen despite treatment

## 2022-04-01 NOTE — ED Triage Notes (Signed)
Pt reports pain in the left thumb x 15 days after she was peeling lemons. Reports the nail in the right thug started turning yellow this morning.
